# Patient Record
Sex: Male | Born: 1955 | Race: White | Hispanic: No | Smoking: Never smoker
Health system: Southern US, Community
[De-identification: ages and names within clinical notes are randomized; demographics above are authoritative.]

## PROBLEM LIST (undated history)

## (undated) DIAGNOSIS — Z87442 Personal history of urinary calculi: Secondary | ICD-10-CM

## (undated) DIAGNOSIS — E039 Hypothyroidism, unspecified: Secondary | ICD-10-CM

---

## 2013-06-06 ENCOUNTER — Ambulatory Visit: Payer: Private Health Insurance - Indemnity | Admitting: Licensed Clinical Social Worker

## 2013-06-13 ENCOUNTER — Ambulatory Visit (INDEPENDENT_AMBULATORY_CARE_PROVIDER_SITE_OTHER): Payer: Private Health Insurance - Indemnity | Admitting: Licensed Clinical Social Worker

## 2013-06-13 DIAGNOSIS — F429 Obsessive-compulsive disorder, unspecified: Secondary | ICD-10-CM

## 2013-06-20 ENCOUNTER — Ambulatory Visit (INDEPENDENT_AMBULATORY_CARE_PROVIDER_SITE_OTHER): Payer: Private Health Insurance - Indemnity | Admitting: Licensed Clinical Social Worker

## 2013-06-20 DIAGNOSIS — F429 Obsessive-compulsive disorder, unspecified: Secondary | ICD-10-CM

## 2013-07-11 ENCOUNTER — Ambulatory Visit (INDEPENDENT_AMBULATORY_CARE_PROVIDER_SITE_OTHER): Payer: Private Health Insurance - Indemnity | Admitting: Licensed Clinical Social Worker

## 2013-07-11 DIAGNOSIS — F429 Obsessive-compulsive disorder, unspecified: Secondary | ICD-10-CM

## 2013-08-01 ENCOUNTER — Ambulatory Visit: Payer: Private Health Insurance - Indemnity | Admitting: Licensed Clinical Social Worker

## 2016-03-28 HISTORY — PX: VARICOSE VEIN SURGERY: SHX832

## 2017-08-22 ENCOUNTER — Emergency Department (HOSPITAL_COMMUNITY): Payer: Managed Care, Other (non HMO)

## 2017-08-22 ENCOUNTER — Other Ambulatory Visit: Payer: Self-pay

## 2017-08-22 ENCOUNTER — Emergency Department (HOSPITAL_COMMUNITY)
Admission: EM | Admit: 2017-08-22 | Discharge: 2017-08-22 | Disposition: A | Discharge status: Home / Self Care | Payer: Managed Care, Other (non HMO) | Attending: Emergency Medicine | Admitting: Emergency Medicine

## 2017-08-22 ENCOUNTER — Encounter (HOSPITAL_COMMUNITY): Payer: Self-pay | Admitting: Obstetrics and Gynecology

## 2017-08-22 DIAGNOSIS — N132 Hydronephrosis with renal and ureteral calculous obstruction: Secondary | ICD-10-CM | POA: Diagnosis not present

## 2017-08-22 DIAGNOSIS — Z79899 Other long term (current) drug therapy: Secondary | ICD-10-CM | POA: Diagnosis not present

## 2017-08-22 DIAGNOSIS — E039 Hypothyroidism, unspecified: Secondary | ICD-10-CM | POA: Insufficient documentation

## 2017-08-22 DIAGNOSIS — K5790 Diverticulosis of intestine, part unspecified, without perforation or abscess without bleeding: Secondary | ICD-10-CM

## 2017-08-22 DIAGNOSIS — R1032 Left lower quadrant pain: Secondary | ICD-10-CM | POA: Diagnosis present

## 2017-08-22 LAB — URINALYSIS, ROUTINE W REFLEX MICROSCOPIC
Bilirubin Urine: NEGATIVE
Glucose, UA: NEGATIVE mg/dL
Ketones, ur: NEGATIVE mg/dL
Leukocytes, UA: NEGATIVE
Nitrite: NEGATIVE
Protein, ur: 30 mg/dL — AB
RBC / HPF: >50 RBC/hpf — ABNORMAL HIGH (ref 0–5)
Specific Gravity, Urine: 1.019 (ref 1.005–1.030)
pH: 7 (ref 5.0–8.0)

## 2017-08-22 LAB — COMPREHENSIVE METABOLIC PANEL
ALT: 21 U/L (ref 17–63)
AST: 20 U/L (ref 15–41)
Albumin: 4.5 g/dL (ref 3.5–5.0)
Alkaline Phosphatase: 78 U/L (ref 38–126)
Anion gap: 12 (ref 5–15)
BUN: 23 mg/dL — ABNORMAL HIGH (ref 6–20)
CO2: 24 mmol/L (ref 22–32)
Calcium: 9.3 mg/dL (ref 8.9–10.3)
Chloride: 107 mmol/L (ref 101–111)
Creatinine, Ser: 1.19 mg/dL (ref 0.61–1.24)
GFR calc Af Amer: >60 mL/min (ref >60)
GFR calc non Af Amer: >60 mL/min (ref >60)
Glucose, Bld: 118 mg/dL — ABNORMAL HIGH (ref 65–99)
Potassium: 4.1 mmol/L (ref 3.5–5.1)
Sodium: 143 mmol/L (ref 135–145)
Total Bilirubin: 0.6 mg/dL (ref 0.3–1.2)
Total Protein: 7.2 g/dL (ref 6.5–8.1)

## 2017-08-22 LAB — CBC
HCT: 42.5 % (ref 39.0–52.0)
Hemoglobin: 14 g/dL (ref 13.0–17.0)
MCH: 32.6 pg (ref 26.0–34.0)
MCHC: 32.9 g/dL (ref 30.0–36.0)
MCV: 98.8 fL (ref 78.0–100.0)
Platelets: 171 10*3/uL (ref 150–400)
RBC: 4.3 MIL/uL (ref 4.22–5.81)
RDW: 12.7 % (ref 11.5–15.5)
WBC: 7.3 10*3/uL (ref 4.0–10.5)

## 2017-08-22 LAB — LIPASE, BLOOD: Lipase: 34 U/L (ref 11–51)

## 2017-08-22 LAB — I-STAT TROPONIN, ED: Troponin i, poc: 0.01 ng/mL (ref 0.00–0.08)

## 2017-08-22 MED ORDER — MORPHINE SULFATE (PF) 4 MG/ML IV SOLN
4.0000 mg | Freq: Once | INTRAVENOUS | Status: AC
  Administered 2017-08-22: 4 mg via INTRAVENOUS
  Filled 2017-08-22: qty 1

## 2017-08-22 MED ORDER — IOPAMIDOL (ISOVUE-300) INJECTION 61%
INTRAVENOUS | Status: AC
  Filled 2017-08-22: qty 100

## 2017-08-22 MED ORDER — MORPHINE SULFATE (PF) 4 MG/ML IV SOLN
4.0000 mg | Freq: Once | INTRAVENOUS | Status: AC | PRN
  Administered 2017-08-22: 4 mg via INTRAVENOUS
  Filled 2017-08-22: qty 1

## 2017-08-22 MED ORDER — ONDANSETRON HCL 4 MG/2ML IJ SOLN
4.0000 mg | Freq: Once | INTRAMUSCULAR | Status: AC
  Administered 2017-08-22: 4 mg via INTRAVENOUS
  Filled 2017-08-22: qty 2

## 2017-08-22 MED ORDER — SODIUM CHLORIDE 0.9 % IV BOLUS
500.0000 mL | Freq: Once | INTRAVENOUS | Status: AC
  Administered 2017-08-22: 500 mL via INTRAVENOUS

## 2017-08-22 MED ORDER — IOPAMIDOL (ISOVUE-300) INJECTION 61%
100.0000 mL | Freq: Once | INTRAVENOUS | Status: AC | PRN
  Administered 2017-08-22: 100 mL via INTRAVENOUS

## 2017-08-22 MED ORDER — HYDROCODONE-ACETAMINOPHEN 5-325 MG PO TABS: 1.0000 | ORAL_TABLET | ORAL | 0 refills | Status: DC | PRN

## 2017-08-22 MED ORDER — ONDANSETRON 4 MG PO TBDP: 4.0000 mg | ORAL_TABLET | Freq: Three times a day (TID) | ORAL | 0 refills | Status: DC | PRN

## 2017-08-22 NOTE — ED Triage Notes (Signed)
Per EMS: Pt has sudden onset left abdominal pain and flank area. PT reports nothing he does makes the pain better or worse. Pt reports he took tums, tried to have a BM with no relief on either. Pt has no hx of GI problems. Pt still has his galbladder and no hx of kidney stones. Pt has had 250 of fluids and has had some dry heaving. Pt was also given 4 of zofran and of fentanyl.

## 2017-08-22 NOTE — ED Notes (Signed)
ED Provider at bedside. 

## 2017-08-22 NOTE — ED Provider Notes (Signed)
[facility]-EMERGENCY DEPT Provider Note   CSN: [id number] Arrival date & time: 08/22/17  1453     History   Chief Complaint Chief Complaint  Patient presents with  . Abdominal Pain    HPI Gregory Davidson is a 62 y.o. male with a past medical history of hypothyroid who presents today for evaluation of abdominal pain.  He reports that he was at work when he had sudden onset of pain in his left lower abdomen/flank area.  He reports the pain is waxing and waning, sharp in nature.  He had fluids,  zofran, 100 mcg of fentanyl by ems PTA.  He has never had abdominal surgery in the past.  He reports no history of kidney stones.  When the pain started he tried tums with out relief.  He had some nausea and dry heaving from the pain.  No diarrhea or constipation.  No trauma.    HPI  History reviewed. No pertinent past medical history.  Patient Active Problem List   Diagnosis Date Noted  . Diverticulosis 08/22/2017    History reviewed. No pertinent surgical history.      Home Medications    Prior to Admission medications   Medication Sig Start Date End Date Taking? Authorizing Provider  levothyroxine (SYNTHROID, LEVOTHROID) 75 MCG tablet Take 75 mcg by mouth daily before breakfast.   Yes [provider]  HYDROcodone-acetaminophen (NORCO/VICODIN) 5-325 MG tablet Take 1 tablet by mouth every 4 (four) hours as needed. 08/22/17   Cristina Gong, PA-C  ondansetron (ZOFRAN ODT) 4 MG disintegrating tablet Take 1 tablet (4 mg total) by mouth every 8 (eight) hours as needed for nausea or vomiting. 08/22/17   Cristina Gong, PA-C    Family History No family history on file.  Social History Social History   Tobacco Use  . Smoking status: Never Smoker  . Smokeless tobacco: Never Used  Substance Use Topics  . Alcohol use: Yes    Comment: Social  . Drug use: Never     Allergies   Lactose intolerance (gi)   Review of Systems Review of  Systems  Constitutional: Negative for chills and fever.  HENT: Negative for ear pain and sore throat.   Eyes: Negative for pain and visual disturbance.  Respiratory: Negative for cough, chest tightness and shortness of breath.   Cardiovascular: Negative for chest pain and palpitations.  Gastrointestinal: Positive for abdominal pain, nausea and vomiting (Dry heaving).  Genitourinary: Negative for dysuria and hematuria.  Musculoskeletal: Positive for back pain. Negative for arthralgias, neck pain and neck stiffness.  Skin: Negative for color change and rash.       Reports that for the past few weeks his bilateral nipples have been sensitive.   Neurological: Negative for dizziness, seizures, syncope and headaches.  All other systems reviewed and are negative.    Physical Exam Updated Vital Signs BP (!) 157/85   Pulse 78   Temp 97.8 F (36.6 C) (Oral)   Resp 17   Ht  (1.753 m)   Wt 86.2 kg (190 lb)   SpO2 95%   BMI 28.06 kg/m   Physical Exam  Constitutional: He is oriented to person, place, and time. He appears well-developed and well-nourished. No distress.  HENT:  Head: Normocephalic and atraumatic.  Eyes: Conjunctivae are normal.  Neck: Neck supple.  Cardiovascular: Normal rate and regular rhythm.  No murmur heard. Pulmonary/Chest: Effort normal and breath sounds normal. No respiratory distress.  Abdominal: Soft. Normal appearance  and bowel sounds are normal. There is no tenderness. There is no rigidity, no rebound, no guarding and no CVA tenderness.  Unable to recreate the abdominal pain with palpation.    Musculoskeletal: He exhibits no edema.  Neurological: He is alert and oriented to person, place, and time.  Skin: Skin is warm and dry.  Psychiatric: He has a normal mood and affect.  Nursing note and vitals reviewed.    ED Treatments / Results  Labs (all labs ordered are listed, but only abnormal results are displayed) Labs Reviewed  COMPREHENSIVE METABOLIC  PANEL - Abnormal; Notable for the following components:      Result Value   Glucose, Bld 118 (*)    BUN 23 (*)    All other components within normal limits  URINALYSIS, ROUTINE W REFLEX MICROSCOPIC - Abnormal; Notable for the following components:   APPearance HAZY (*)    Hgb urine dipstick LARGE (*)    Protein, ur 30 (*)    RBC / HPF >50 (*)    Bacteria, UA RARE (*)    All other components within normal limits  LIPASE, BLOOD  CBC  I-STAT TROPONIN, ED    EKG None  Radiology Ct Abdomen Pelvis W Contrast  Result Date: 08/22/2017 CLINICAL DATA:  Left abdominal pain.  Suspect diverticulitis EXAM: CT ABDOMEN AND PELVIS WITH CONTRAST TECHNIQUE: Multidetector CT imaging of the abdomen and pelvis was performed using the standard protocol following bolus administration of intravenous contrast. CONTRAST:  ISOVUE-300 IOPAMIDOL (ISOVUE-300) INJECTION 61% COMPARISON:  None. FINDINGS: Lower chest: Lung bases clear bilaterally. Hepatobiliary: Gallbladder and bile ducts normal.  Normal liver. Pancreas: Negative Spleen: Negative Adrenals/Urinary Tract: Left hydronephrosis with perinephric edema. 5 x 6 mm obstructing stone proximal left ureter. Right kidney normal. Normal urinary bladder. Stomach/Bowel: Negative for bowel obstruction. No bowel mass or edema. Mild sigmoid diverticulosis without diverticulitis. Normal appendix Vascular/Lymphatic: Mild atherosclerotic disease in the aorta. No adenopathy. Reproductive: Moderate prostate enlargement. Other: No free fluid.  Small inguinal hernia bilaterally. Musculoskeletal: Negative IMPRESSION: 4 x 6 mm obstructing stone proximal left ureter. Sigmoid diverticulosis without diverticulitis. Electronically Signed   By: Marlan Palau M.D.   On: 08/22/2017 17:16    Procedures Procedures (including critical care time)  Medications Ordered in ED Medications  sodium chloride 0.9 % bolus 500 mL (0 mLs Intravenous Stopped 08/22/17 1740)  morphine 4 MG/ML  injection 4 mg (4 mg Intravenous Given 08/22/17 1620)  iopamidol (ISOVUE-300) 61 % injection 100 mL (100 mLs Intravenous Contrast Given 08/22/17 1700)  morphine 4 MG/ML injection 4 mg (4 mg Intravenous Given 08/22/17 1806)  ondansetron (ZOFRAN) injection 4 mg (4 mg Intravenous Given 08/22/17 1806)     Initial Impression / Assessment and Plan / ED Course  I have reviewed the triage vital signs and the nursing notes.  Pertinent labs & imaging results that were available during my care of the patient were reviewed by me and considered in my medical decision making (see chart for details).  Clinical Course as of Aug 24 111  Tue Aug 22, 2017  1716 Notified by CT rad tech that patient was in a large amount of pain.  Additional pain medicine reordered.   [EH]  1811 Left-sided 5 x 6 mm proximal obstructing renal stone.   [EH]  1930 Patient re-evaluated, nausea and pain are controlled, states is ready for discharge.    [EH]    Clinical Course User Index [EH] Cristina Gong, PA-C   Patient presents today for  evaluation of sudden onset of left abdominal/flank pain.  He had a sudden onset of this today while he was at work.  Urine was hazy with large blood, 30 protein, nitrate and leukocyte negative.  Troponin was normal, normal lipase and LFTs.  CT abdomen pelvis with contrast was obtained showing diverticulosis without evidence of diverticulitis, and a left-sided 5 x 6 mm proximal right ureteral stone that is obstructing with hydronephrosis.  Urine is not obviously infected.  Discussed with patient goals of symptom management.  His pain was treated in the emergency room with morphine which provided significant relief.  His nausea was treated with Zofran.  Discussed with patient that his stone is large and he needs to follow-up with urology as there is a good chance that he may not be able to pass it based on its size.  He was given prescriptions for pain and nausea medicines at home.  Return  precautions were discussed with patient who states his understanding.  He was given urology follow-up with instructions to call them tomorrow.  Patient discharged home.  Final Clinical Impressions(s) / ED Diagnoses   Final diagnoses:  Ureteral stone with hydronephrosis  Diverticulosis    ED Discharge Orders        Ordered    ondansetron (ZOFRAN ODT) 4 MG disintegrating tablet  Every 8 hours PRN     08/22/17 1935    HYDROcodone-acetaminophen (NORCO/VICODIN) 5-325 MG tablet  Every 4 hours PRN     08/22/17 1935       Norman Clay 08/23/17 0113    Loren Racer, MD 08/28/17 1520

## 2017-08-22 NOTE — Discharge Instructions (Addendum)
Today your scan showed a left-sided 5 x 6 mm obstructing ureteral stone.  This is a kidney stone in the tube between your bladder and kidney.  I have given you follow-up with urology.  Please call them tomorrow morning for an appointment.    Please take Ibuprofen (Advil, motrin) and Tylenol (acetaminophen) to relieve your pain.  You may take up to 600 MG (3 pills) of normal strength ibuprofen every 8 hours as needed.  In between doses of ibuprofen you make take tylenol, up to 1,000 mg (two extra strength pills).  Do not take more than 3,000 mg tylenol in a 24 hour period.  Please check all medication labels as many medications such as pain and cold medications may contain tylenol.  Do not drink alcohol while taking these medications.  Do not take other NSAID'S while taking ibuprofen (such as aleve or naproxen).  Please take ibuprofen with food to decrease stomach upset.  Today you received medications that may make you sleepy or impair your ability to make decisions.  For the next 24 hours please do not drive, operate heavy machinery, care for a small child with out another adult present, or perform any activities that may cause harm to you or someone else if you were to fall asleep or be impaired.   You are being prescribed a medication which may make you sleepy. Please follow up of listed precautions for at least 24 hours after taking one dose.

## 2017-08-22 NOTE — ED Notes (Signed)
Bed: [facility] Expected date:  Expected time:  Means of arrival:  Comments: 

## 2017-08-24 ENCOUNTER — Encounter (HOSPITAL_COMMUNITY): Payer: Self-pay | Admitting: *Deleted

## 2017-08-24 ENCOUNTER — Other Ambulatory Visit: Payer: Self-pay | Admitting: Urology

## 2017-08-25 ENCOUNTER — Encounter (HOSPITAL_COMMUNITY): Payer: Self-pay | Admitting: *Deleted

## 2017-08-28 ENCOUNTER — Encounter (HOSPITAL_COMMUNITY)
Admission: RE | Disposition: A | Discharge status: Home / Self Care | Payer: Self-pay | Source: Other Acute Inpatient Hospital | Attending: Urology

## 2017-08-28 ENCOUNTER — Other Ambulatory Visit: Payer: Self-pay

## 2017-08-28 ENCOUNTER — Encounter (HOSPITAL_COMMUNITY): Payer: Self-pay | Admitting: *Deleted

## 2017-08-28 ENCOUNTER — Ambulatory Visit (HOSPITAL_COMMUNITY): Payer: Managed Care, Other (non HMO)

## 2017-08-28 ENCOUNTER — Ambulatory Visit (HOSPITAL_COMMUNITY)
Admission: RE | Admit: 2017-08-28 | Discharge: 2017-08-28 | Disposition: A | Discharge status: Home / Self Care | Payer: Managed Care, Other (non HMO) | Source: Other Acute Inpatient Hospital | Attending: Urology | Admitting: Urology

## 2017-08-28 DIAGNOSIS — N201 Calculus of ureter: Secondary | ICD-10-CM | POA: Diagnosis present

## 2017-08-28 DIAGNOSIS — E039 Hypothyroidism, unspecified: Secondary | ICD-10-CM | POA: Insufficient documentation

## 2017-08-28 DIAGNOSIS — Z79890 Hormone replacement therapy (postmenopausal): Secondary | ICD-10-CM | POA: Diagnosis not present

## 2017-08-28 HISTORY — PX: EXTRACORPOREAL SHOCK WAVE LITHOTRIPSY: SHX1557

## 2017-08-28 HISTORY — DX: Hypothyroidism, unspecified: E03.9

## 2017-08-28 HISTORY — DX: Personal history of urinary calculi: Z87.442

## 2017-08-28 SURGERY — LITHOTRIPSY, ESWL
Anesthesia: LOCAL | Laterality: Left

## 2017-08-28 MED ORDER — CIPROFLOXACIN HCL 500 MG PO TABS
500.0000 mg | ORAL_TABLET | ORAL | Status: AC
  Administered 2017-08-28: 500 mg via ORAL
  Filled 2017-08-28: qty 1

## 2017-08-28 MED ORDER — SODIUM CHLORIDE 0.9 % IV SOLN
INTRAVENOUS | Status: DC
  Administered 2017-08-28: 10:00:00 via INTRAVENOUS

## 2017-08-28 MED ORDER — DIAZEPAM 5 MG PO TABS
10.0000 mg | ORAL_TABLET | ORAL | Status: AC
  Administered 2017-08-28: 10 mg via ORAL
  Filled 2017-08-28: qty 2

## 2017-08-28 MED ORDER — DIPHENHYDRAMINE HCL 25 MG PO CAPS
25.0000 mg | ORAL_CAPSULE | ORAL | Status: AC
  Administered 2017-08-28: 25 mg via ORAL
  Filled 2017-08-28: qty 1

## 2017-08-28 MED ORDER — TRAMADOL HCL 50 MG PO TABS: 50.0000 mg | ORAL_TABLET | Freq: Four times a day (QID) | ORAL | 0 refills | Status: AC | PRN

## 2017-08-28 NOTE — H&P (Signed)
CC: I have ureteral stone.  HPI: Gregory Davidson is a 62 year-old male patient who was referred by Dr. Derenda Fennel, MD who is here for ureteral stone.  The problem is on the left side. He first stated noticing pain on 08/22/2017. This is his first kidney stone. He is currently having flank pain. He denies having back pain, groin pain, nausea, vomiting, fever, and chills. Pain is occuring on the left side. He has not caught a stone in his urine strainer since his symptoms began.   He has never had surgical treatment for calculi in the past.   Patient developed left flank pain. He underwent CT scan Aug 22, 2017 that revealed a 6 mm left proximal ureteral stone (ssd 11 cm, HU 1030, visible). No other stones. WBC = 7, bun 23, cr 1.19, UA with 11-20 wbc, > 50 rbc, Neg LE / N.   He has pain and takes hydrocodone and ibuprofen.     ALLERGIES: None   MEDICATIONS: Levothyroxine Sodium 75 mcg tablet     GU PSH: No GU PSH      PSH Notes: cosmetic vein work done on rt leg per pt   NON-GU PSH: No Non-GU PSH    GU PMH: None     PMH Notes:  1898-03-28 00:00:00 - Note: Normal Routine History And Physical Adult   NON-GU PMH: Hypothyroidism    FAMILY HISTORY: No pertinent family history - Runs in Family   SOCIAL HISTORY: Marital Status: Divorced Preferred Language: English; Race: White Current Smoking Status: Patient has never smoked.   Tobacco Use Assessment Completed: Used Tobacco in last 30 days? Has never drank.  Drinks 1 caffeinated drink per day. 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     Notes: 2 daughters   REVIEW OF SYSTEMS:    GU Review Male:   Patient reports hard to postpone urination, get up at night to urinate, and erection problems. Patient denies frequent urination, burning/ pain with urination, leakage of urine, stream starts and stops, trouble starting your stream, have to strain to urinate , and penile pain.  Gastrointestinal (Upper):   Patient reports nausea and  vomiting. Patient denies indigestion/ heartburn.  Gastrointestinal (Lower):   Patient reports diarrhea. Patient denies constipation.  Constitutional:   Patient denies fever, night sweats, weight loss, and fatigue.  Skin:   Patient denies skin rash/ lesion and itching.  Eyes:   Patient denies blurred vision and double vision.  Ears/ Nose/ Throat:   Patient denies sore throat and sinus problems.  Hematologic/Lymphatic:   Patient denies swollen glands and easy bruising.  Cardiovascular:   Patient denies leg swelling and chest pains.  Respiratory:   Patient denies cough and shortness of breath.  Endocrine:   Patient denies excessive thirst.  Musculoskeletal:   Patient reports joint pain. Patient denies back pain.  Neurological:   Patient denies headaches and dizziness.  Psychologic:   Patient denies depression and anxiety.   VITAL SIGNS:      08/24/2017 11:12 AM  Weight 185 lb / 83.91 kg  Height 69 in / 175.26 cm  BP 151/81 mmHg  Pulse 65 /min  Temperature 97.9 F / 36.6 C  BMI 27.3 kg/m   MULTI-SYSTEM PHYSICAL EXAMINATION:    Constitutional: Well-nourished. No physical deformities. Normally developed. Good grooming.  Neck: Neck symmetrical, not swollen. Normal tracheal position.  Respiratory: No labored breathing, no use of accessory muscles. Normal breath sounds.  Cardiovascular: Regular rate and rhythm.Normal temperature, normal extremity pulses, no swelling,  no varicosities.   Lymphatic: No enlargement of neck, axillae, groin.  Skin: No paleness, no jaundice, no cyanosis. No lesion, no ulcer, no rash.  Neurologic / Psychiatric: Oriented to time, oriented to place, oriented to person. No depression, no anxiety, no agitation.  Gastrointestinal: No mass, no tenderness, no rigidity, non obese abdomen.  Eyes: Normal conjunctivae. Normal eyelids.  Ears, Nose, Mouth, and Throat: Left ear no scars, no lesions, no masses. Right ear no scars, no lesions, no masses. Nose no scars, no lesions,  no masses. Normal hearing. Normal lips.  Musculoskeletal: Normal gait and station of head and neck.     PAST DATA REVIEWED:  Source Of History:  Patient  Lab Test Review:   BUN/Creatinine  Records Review:   Previous Hospital Records  X-Ray Review: C.T. Abdomen/Pelvis: Reviewed Films. may 2019     PROCEDURES:         KUB - 74018  A single view of the abdomen is obtained.  Calculi:  6 mm left proximal stone       The bones appeared normal. The bowel gas pattern appeared normal. The soft tissues were unremarkable.         Urinalysis w/Scope - 81001 Dipstick Dipstick Cont'd Micro  Color: Yellow Bilirubin: Neg WBC/hpf: NS (Not Seen)  Appearance: Clear Ketones: 1+ RBC/hpf: 0 - 2/hpf  Specific Gravity: 1.025 Blood: Trace Bacteria: Rare (0-9/hpf)  pH: 5.5 Protein: Trace Cystals: NS (Not Seen)  Glucose: Neg Urobilinogen: 0.2 Casts: NS (Not Seen)    Nitrites: Neg Trichomonas: Not Present    Leukocyte Esterase: Neg Mucous: Not Present      Epithelial Cells: NS (Not Seen)      Yeast: NS (Not Seen)      Sperm: Not Present         Ketoralac 60mg  - [id number], [id number] Qty: 60 Adm. By: Delmar Landau  Unit: mg Lot No [id number]  Route: IM Exp. Date 03/29/2019  Freq: None Mfgr.:   Site: Right Buttock         Phenergan 25mg  - [id number], [id number] Qty: 25 Adm. By: Delmar Landau  Unit: mg Lot No [id number]  Route: IM Exp. Date 11/27/2018  Freq: None Mfgr.:   Site: Left Buttock   ASSESSMENT:      ICD-10 Details  1 GU:   Ureteral calculus - N20.1    PLAN:            Medications New Meds: Promethazine Hcl 12.5 mg tablet 1 tablet PO Q 6 H   #12  0 Refill(s)  Tramadol Hcl 50 mg tablet 1-2 tablet PO Q 6 H PRN   #15  0 Refill(s)            Orders X-Rays: KUB          Schedule Return Visit/Planned Activity: Next Available Appointment - Schedule Surgery             Note: ESWL for Monday           Document Letter(s):  Created for Chart: Work Excuse   Created for Patient: Clinical Summary          Notes:   left ureteral stone - he was given ketorolac 60 mg IM and promethazine . KUB today with stone in left proximal ureter. I discussed with the patient the nature risks and benefits of continued stone passage, off label use of alpha blockers, shockwave lithotripsy or ureteroscopy. All questions answered. He will proceed with left ESWL. Also, he'll start a pepcid  or zantac OTC. He c/o indigestion. We discussed dietary changes to prevent stones.   cc: Dr. Lenise ArenaMeyers         Next Appointment:      Next Appointment: 08/28/2017 11:15 AM    Appointment Type: Surgery     Location: [facility] [phone]    Provider: Modena SlaterEugene Bell, III, M.D.    Reason for Visit: [facility]/OP LEFT ESWL      Signed by Jerilee FieldMatthew Eskridge, M.D. on 08/24/17 at 6:33 PM (EDT

## 2017-08-28 NOTE — Discharge Instructions (Signed)
Moderate Conscious Sedation, Adult, Care After °These instructions provide you with information about caring for yourself after your procedure. Your health care provider may also give you more specific instructions. Your treatment has been planned according to current medical practices, but problems sometimes occur. Call your health care provider if you have any problems or questions after your procedure. °What can I expect after the procedure? °After your procedure, it is common: °To feel sleepy for several hours. °To feel clumsy and have poor balance for several hours. °To have poor judgment for several hours. °To vomit if you eat too soon. ° °Follow these instructions at home: °For at least 24 hours after the procedure: ° °Do not: °Participate in activities where you could fall or become injured. °Drive. °Use heavy machinery. °Drink alcohol. °Take sleeping pills or medicines that cause drowsiness. °Make important decisions or sign legal documents. °Take care of children on your own. °Rest. °Eating and drinking °Follow the diet recommended by your health care provider. °If you vomit: °Drink water, juice, or soup when you can drink without vomiting. °Make sure you have little or no nausea before eating solid foods. °General instructions °Have a responsible adult stay with you until you are awake and alert. °Take over-the-counter and prescription medicines only as told by your health care provider. °If you smoke, do not smoke without supervision. °Keep all follow-up visits as told by your health care provider. This is important. °Contact a health care provider if: °You keep feeling nauseous or you keep vomiting. °You feel light-headed. °You develop a rash. °You have a fever. °Get help right away if: °You have trouble breathing. °This information is not intended to replace advice given to you by your health care provider. Make sure you discuss any questions you have with your health care provider. °Document Released:  01/02/2013 Document Revised: 08/17/2015 Document Reviewed: 07/04/2015 °Elsevier Interactive Patient Education © 2018 [employer] °Lithotripsy, Care After °This sheet gives you information about how to care for yourself after your procedure. Your health care provider may also give you more specific instructions. If you have problems or questions, contact your health care provider. °What can I expect after the procedure? °After the procedure, it is common to have: °· Some blood in your urine. This should only last for a few days. °· Soreness in your back, sides, or upper abdomen for a few days. °· Blotches or bruises on your back where the pressure wave entered the skin. °· Pain, discomfort, or nausea when pieces (fragments) of the kidney stone move through the tube that carries urine from the kidney to the bladder (ureter). Stone fragments may pass soon after the procedure, but they may continue to pass for up to 4-8 weeks. °? If you have severe pain or nausea, contact your health care provider. This may be caused by a large stone that was not broken up, and this may mean that you need more treatment. °· Some pain or discomfort during urination. °· Some pain or discomfort in the lower abdomen or (in men) at the base of the penis. ° °Follow these instructions at home: °Medicines °· Take over-the-counter and prescription medicines only as told by your health care provider. °· If you were prescribed an antibiotic medicine, take it as told by your health care provider. Do not stop taking the antibiotic even if you start to feel better. °· Do not drive for 24 hours if you were given a medicine to help you relax (sedative). °· Do not drive   or use heavy machinery while taking prescription pain medicine. °Eating and drinking °· Drink enough water and fluids to keep your urine clear or pale yellow. This helps any remaining pieces of the stone to pass. It can also help prevent new stones from forming. °· Eat plenty of fresh  fruits and vegetables. °· Follow instructions from your health care provider about eating and drinking restrictions. You may be instructed: °? To reduce how much salt (sodium) you eat or drink. Check ingredients and nutrition facts on packaged foods and beverages. °? To reduce how much meat you eat. °· Eat the recommended amount of calcium for your age and gender. Ask your health care provider how much calcium you should have. °General instructions °· Get plenty of rest. °· Most people can resume normal activities 1-2 days after the procedure. Ask your health care provider what activities are safe for you. °· If directed, strain all urine through the strainer that was provided by your health care provider. °? Keep all fragments for your health care provider to see. Any stones that are found may be sent to a medical lab for examination. The stone may be as small as a grain of salt. °· Keep all follow-up visits as told by your health care provider. This is important. °Contact a health care provider if: °· You have pain that is severe or does not get better with medicine. °· You have nausea that is severe or does not go away. °· You have blood in your urine longer than your health care provider told you to expect. °· You have more blood in your urine. °· You have pain during urination that does not go away. °· You urinate more frequently than usual and this does not go away. °· You develop a rash or any other possible signs of an allergic reaction. °Get help right away if: °· You have severe pain in your back, sides, or upper abdomen. °· You have severe pain while urinating. °· Your urine is very dark red. °· You have blood in your stool (feces). °· You cannot pass any urine at all. °· You feel a strong urge to urinate after emptying your bladder. °· You have a fever or chills. °· You develop shortness of breath, difficulty breathing, or chest pain. °· You have severe nausea that leads to persistent vomiting. °· You  faint. °Summary °· After this procedure, it is common to have some pain, discomfort, or nausea when pieces (fragments) of the kidney stone move through the tube that carries urine from the kidney to the bladder (ureter). If this pain or nausea is severe, however, you should contact your health care provider. °· Most people can resume normal activities 1-2 days after the procedure. Ask your health care provider what activities are safe for you. °· Drink enough water and fluids to keep your urine clear or pale yellow. This helps any remaining pieces of the stone to pass, and it can help prevent new stones from forming. °· If directed, strain your urine and keep all fragments for your health care provider to see. Fragments or stones may be as small as a grain of salt. °· Get help right away if you have severe pain in your back, sides, or upper abdomen or have severe pain while urinating. °This information is not intended to replace advice given to you by your health care provider. Make sure you discuss any questions you have with your health care provider. °Document Released: 04/03/2007 Document Revised:   02/03/2016 Document Reviewed: 02/03/2016 °Elsevier Interactive Patient Education © 2018 [employer] ° °

## 2017-08-28 NOTE — Progress Notes (Signed)
Pt and his friend returned to facility to state that did not have pain med prescription in which nurse stated she had provide to Bill pts friend with discharge paperwork. Both friend and pt denied having and Dr Alvester MorinBell made aware of situation and provided new prescription.

## 2017-08-29 ENCOUNTER — Encounter (HOSPITAL_COMMUNITY): Payer: Self-pay | Admitting: Urology

## 2018-11-12 ENCOUNTER — Other Ambulatory Visit: Payer: Self-pay | Admitting: Nurse Practitioner

## 2018-11-12 DIAGNOSIS — R1031 Right lower quadrant pain: Secondary | ICD-10-CM

## 2018-11-13 ENCOUNTER — Ambulatory Visit
Admission: RE | Admit: 2018-11-13 | Discharge: 2018-11-13 | Disposition: A | Discharge status: Home / Self Care | Payer: Managed Care, Other (non HMO) | Source: Ambulatory Visit | Attending: Nurse Practitioner | Admitting: Nurse Practitioner

## 2018-11-13 DIAGNOSIS — R1031 Right lower quadrant pain: Secondary | ICD-10-CM

## 2018-11-28 ENCOUNTER — Ambulatory Visit: Payer: Self-pay | Admitting: General Surgery

## 2018-11-28 NOTE — H&P (Signed)
History of Present Illness Gregory Ok MD; 11/28/2018 10:40 AM) The patient is a 63 year old male who presents with an inguinal hernia.Referred by: Dr. Elesa Davidson Chief Complaint: Right inguinal hernia  Patient is a 63 year old male with past medical history significant for hypothyroidism. Patient comes in secondary to a 2 to three-month history of the right inguinal hernia. Patient states that he noticed some "tingling" to the right inguinal area with bending over and lifting. Patient works at [employer] and does do some heavy lifting at times. Patient states that he has not noticed any bulge the right inguinal area. He states when he is not lifting he has no pain. Patient has had no signs or symptoms of incarceration or strangulation.  He's had no previous abdominal surgery.    Past Surgical History Gregory Davidson Gregory Davidson, Gregory Davidson; 11/28/2018 10:29 AM) No pertinent past surgical history   Diagnostic Studies History Gregory Davidson Gregory Davidson, Gregory Davidson; 11/28/2018 10:29 AM) Colonoscopy  1-5 years ago  Allergies Gregory Davidson Gregory Davidson, Gregory Davidson; 11/28/2018 10:30 AM) No Known Drug Allergies  [11/28/2018]: Allergies Reconciled   Medication History [employer], Gregory Davidson; 11/28/2018 10:31 AM) Levothyroxine Sodium (75MCG Tablet, Oral) Active. Finasteride (1MG  Tablet, Oral) Active. Medications Reconciled  Social History Gregory Davidson Gregory Davidson, Gregory Davidson; 11/28/2018 10:29 AM) Alcohol use  Occasional alcohol use. Caffeine use  Carbonated beverages. No drug use  Tobacco use  Never smoker.  Family History Gregory Davidson Gregory Davidson, Gregory Davidson; 11/28/2018 10:29 AM) First Degree Relatives  No pertinent family history   Other Problems Gregory Davidson Gregory Davidson, Gregory Davidson; 11/28/2018 10:29 AM) Thyroid Disease     Review of Systems Gregory Ok MD; 11/28/2018 10:38 AM) General Not Present- Appetite Loss, Chills, Fatigue, Fever, Night Sweats, Weight Gain and Weight Loss. Skin Not Present- Change in Wart/Mole, Dryness, Hives, Jaundice,  New Lesions, Non-Healing Wounds, Rash and Ulcer. HEENT Not Present- Earache, Hearing Loss, Hoarseness, Nose Bleed, Oral Ulcers, Ringing in the Ears, Seasonal Allergies, Sinus Pain, Sore Throat, Visual Disturbances, Wears glasses/contact lenses and Yellow Eyes. Respiratory Not Present- Bloody sputum, Chronic Cough, Difficulty Breathing, Snoring and Wheezing. Breast Not Present- Breast Mass, Breast Pain, Nipple Discharge and Skin Changes. Cardiovascular Not Present- Chest Pain, Difficulty Breathing Lying Down, Leg Cramps, Palpitations, Rapid Heart Rate, Shortness of Breath and Swelling of Extremities. Gastrointestinal Present- Abdominal Pain. Not Present- Bloating, Bloody Stool, Change in Bowel Habits, Chronic diarrhea, Constipation, Difficulty Swallowing, Excessive gas, Gets full quickly at meals, Hemorrhoids, Indigestion, Nausea, Rectal Pain and Vomiting. Male Genitourinary Not Present- Blood in Urine, Change in Urinary Stream, Frequency, Impotence, Nocturia, Painful Urination, Urgency and Urine Leakage. Musculoskeletal Not Present- Back Pain, Joint Pain, Joint Stiffness, Muscle Pain, Muscle Weakness and Swelling of Extremities. Neurological Not Present- Decreased Memory, Fainting, Headaches, Numbness, Seizures, Tingling, Tremor, Trouble walking and Weakness. Psychiatric Not Present- Anxiety, Bipolar, Change in Sleep Pattern, Depression, Fearful and Frequent crying. Endocrine Not Present- Cold Intolerance, Excessive Hunger, Hair Changes, Heat Intolerance, Hot flashes and New Diabetes. Hematology Not Present- Blood Thinners, Easy Bruising, Excessive bleeding, Gland problems, HIV and Persistent Infections. All other systems negative  Vitals Gregory Davidson Gregory Davidson Gregory Davidson; 11/28/2018 10:31 AM) 11/28/2018 10:31 AM Weight: 183.6 lb Height: 69in Body Surface Area: 1.99 m Body Mass Index: 27.11 kg/m  Temp.: 4F (Temporal)  Pulse: 60 (Regular)  P.OX: 60% (Room air) BP: 142/78(Sitting, Left Arm,  Standard)       Physical Exam Gregory Ok MD; 11/28/2018 10:40 AM) The physical exam findings are as follows: Note: Constitutional: No acute distress, conversant, appears stated age  Eyes: Anicteric sclerae, moist conjunctiva, no lid lag  Neck: No thyromegaly, trachea midline, no cervical lymphadenopathy  Lungs: Clear to auscultation biilaterally, normal respiratory effot  Cardiovascular: regular rate & rhythm, no murmurs, no peripheal edema, pedal pulses 2+  GI: Soft, no masses or hepatosplenomegaly, non-tender to palpation  MSK: Normal gait, no clubbing cyanosis, edema  Skin: No rashes, palpation reveals normal skin turgor  Psychiatric: Appropriate judgment and insight, oriented to person, place, and time  Abdomen Inspection Hernias - Inguinal hernia - Reducible - Right.    Assessment & Plan [name](Gregory Ramirez MD; 11/28/2018 10:40 AM)  RIGHT INGUINAL HERNIA (K40.90) Impression: Patient is a 63 year old male with a history of hypothyroidism Patient with a right inguinal hernia  1. The patient will like to proceed to the operating room for laparoscopic right inguinal hernia repair with mesh.  2. I discussed with the patient the signs and symptoms of incarceration and strangulation and the need to proceed to the ER should they occur.  3. I discussed with the patient the risks and benefits of the procedure to include but not limited to: Infection, bleeding, damage to surrounding structures, possible need for further surgery, possible nerve pain, and possible recurrence. The patient was understanding and wishes to proceed.

## 2019-04-25 ENCOUNTER — Ambulatory Visit: Payer: Managed Care, Other (non HMO) | Admitting: Cardiology

## 2019-05-01 NOTE — Progress Notes (Signed)
Referring-Janice Quillian Quince NP Reason for referral-Chest pain  HPI: 64 yo male for evaluation of chest pain at request of Carolee Rota NP.  Patient typically does not have dyspnea on exertion, orthopnea, PND, pedal edema, exertional chest pain or syncope.  He occasionally feels sore in his chest after working.  2 weeks ago he was sitting at a computer and developed substernal chest pressure radiating to his right upper extremity.  He describes a tingling sensation in his right upper extremity and a flushed feeling/tingling in his right face.  Symptoms lasted approximately 30 seconds and resolved.  There was no nausea, dyspnea or diaphoresis.  Cardiology now asked to evaluate.  Also note he occasionally feels a brief skip but no sustained palpitations.  Current Outpatient Medications  Medication Sig Dispense Refill  . levothyroxine (SYNTHROID, LEVOTHROID) 75 MCG tablet Take 75 mcg by mouth daily before breakfast.     No current facility-administered medications for this visit.    Allergies  Allergen Reactions  . Lactose Intolerance (Gi) Diarrhea     Past Medical History:  Diagnosis Date  . History of kidney stones   . Hypothyroidism    takes synthroid    Past Surgical History:  Procedure Laterality Date  . EXTRACORPOREAL SHOCK WAVE LITHOTRIPSY Left 08/28/2017   Procedure: LEFT EXTRACORPOREAL SHOCK WAVE LITHOTRIPSY (ESWL);  Surgeon: Lucas Mallow, MD;  Location: [facility];  Service: Urology;  Laterality: Left;  Marland Kitchen VARICOSE VEIN SURGERY Right 2018    Social History   Socioeconomic History  . Marital status: Divorced    Spouse name: Not on file  . Number of children: 2  . Years of education: Not on file  . Highest education level: Not on file  Occupational History  . Not on file  Tobacco Use  . Smoking status: Never Smoker  . Smokeless tobacco: Never Used  Substance and Sexual Activity  . Alcohol use: Yes    Comment: Social  . Drug use: Never  . Sexual activity: Yes    Other Topics Concern  . Not on file  Social History Narrative  . Not on file   Social Determinants of Health   Financial Resource Strain:   . Difficulty of Paying Living Expenses: Not on file  Food Insecurity:   . Worried About Charity fundraiser in the Last Year: Not on file  . Ran Out of Food in the Last Year: Not on file  Transportation Needs:   . Lack of Transportation (Medical): Not on file  . Lack of Transportation (Non-Medical): Not on file  Physical Activity:   . Days of Exercise per Week: Not on file  . Minutes of Exercise per Session: Not on file  Stress:   . Feeling of Stress : Not on file  Social Connections:   . Frequency of Communication with Friends and Family: Not on file  . Frequency of Social Gatherings with Friends and Family: Not on file  . Attends Religious Services: Not on file  . Active Member of Clubs or Organizations: Not on file  . Attends Archivist Meetings: Not on file  . Marital Status: Not on file  Intimate Partner Violence:   . Fear of Current or Ex-Partner: Not on file  . Emotionally Abused: Not on file  . Physically Abused: Not on file  . Sexually Abused: Not on file    History reviewed. No pertinent family history.  ROS: no fevers or chills, productive cough, hemoptysis, dysphasia, odynophagia, melena, hematochezia,  dysuria, hematuria, rash, seizure activity, orthopnea, PND, pedal edema, claudication. Remaining systems are negative.  Physical Exam:   Blood pressure 120/84, pulse 62, height 5\' 9"  (1.753 m), weight 186 lb (84.4 kg), SpO2 96 %.  General:  Well developed/well nourished in NAD Skin warm/dry Patient not depressed No peripheral clubbing Back-normal HEENT-normal/normal eyelids Neck supple/normal carotid upstroke bilaterally; no bruits; no JVD; no thyromegaly chest - CTA/ normal expansion CV - RRR/normal S1 and S2; no murmurs, rubs or gallops;  PMI nondisplaced Abdomen -NT/ND, no HSM, no mass, + bowel sounds,  no bruit 2+ femoral pulses, no bruits Ext-no edema, chords, 2+ DP Neuro-grossly nonfocal  ECG - April 17, 2019 showed sinus rhythm, no ST changes.  Today's electrocardiogram shows sinus rhythm with nonspecific ST changes.  Personally reviewed  A/P  1 Chest pain-etiology unclear.  Some of the description is concerning but short duration.  Electrocardiogram shows mild ST depression.  I will arrange a CTA to exclude significant coronary disease.  2 facial and right upper extremity tingling-we will arrange carotid Dopplers to exclude significant carotid stenosis.  3 hypothyroid-Per primary care.  April 19, 2019, MD

## 2019-05-08 ENCOUNTER — Ambulatory Visit (INDEPENDENT_AMBULATORY_CARE_PROVIDER_SITE_OTHER): Payer: Managed Care, Other (non HMO) | Admitting: Cardiology

## 2019-05-08 ENCOUNTER — Encounter: Payer: Self-pay | Admitting: Cardiology

## 2019-05-08 ENCOUNTER — Other Ambulatory Visit: Payer: Self-pay

## 2019-05-08 VITALS — BP 120/84 | HR 62 | Ht 69.0 in | Wt 186.0 lb

## 2019-05-08 DIAGNOSIS — R072 Precordial pain: Secondary | ICD-10-CM

## 2019-05-08 DIAGNOSIS — G459 Transient cerebral ischemic attack, unspecified: Secondary | ICD-10-CM

## 2019-05-08 MED ORDER — METOPROLOL TARTRATE 100 MG PO TABS: ORAL_TABLET | ORAL | 0 refills | Status: DC

## 2019-05-08 NOTE — Patient Instructions (Signed)
Medication Instructions:  NO CHANGE *If you need a refill on your cardiac medications before your next appointment, please call your pharmacy*  Lab Work: If you have labs (blood work) drawn today and your tests are completely normal, you will receive your results only by: Marland Kitchen MyChart Message (if you have MyChart) OR . A paper copy in the mail If you have any lab test that is abnormal or we need to change your treatment, we will call you to review the results.  Testing/Procedures: Your physician has requested that you have a carotid duplex. This test is an ultrasound of the carotid arteries in your neck. It looks at blood flow through these arteries that supply the brain with blood. Allow one hour for this exam. There are no restrictions or special instructions.[place] [facility]  Your cardiac CT will be scheduled at one of the below locations:   [facility] [address] [place], [place] [address] [phone]  OR  [facility] [address] [place], [place] [address] [phone]  If scheduled at [facility], please arrive at the [facility] main entrance of [facility] 30 minutes prior to test start time. Proceed to the [facility] Radiology Department (first floor) to check-in and test prep.  If scheduled at [facility], please arrive 15 mins early for check-in and test prep.  Please follow these instructions carefully (unless otherwise directed):  Hold all erectile dysfunction medications at least 3 days (72 hrs) prior to test.  On the Night Before the Test: . Be sure to Drink plenty of water. . Do not consume any caffeinated/decaffeinated beverages or chocolate 12 hours prior to your test. . Do not take any antihistamines 12 hours prior to your test. . If you take Metformin do not take 24 hours prior to test.  On the Day of the Test: . Drink plenty of water. Do not drink  any water within one hour of the test. . Do not eat any food 4 hours prior to the test. . You may take your regular medications prior to the test.  . Take metoprolol (Lopressor) 100 MG two hours prior to test. . HOLD Furosemide/Hydrochlorothiazide morning of the test. . FEMALES- please wear underwire-free bra if available       After the Test: . Drink plenty of water. . After receiving IV contrast, you may experience a mild flushed feeling. This is normal. . On occasion, you may experience a mild rash up to 24 hours after the test. This is not dangerous. If this occurs, you can take Benadryl 25 mg and increase your fluid intake. . If you experience trouble breathing, this can be serious. If it is severe call 911 IMMEDIATELY. If it is mild, please call our office. . If you take any of these medications: Glipizide/Metformin, Avandament, Glucavance, please do not take 48 hours after completing test unless otherwise instructed.   Once we have confirmed authorization from your insurance company, we will call you to set up a date and time for your test.   For non-scheduling related questions, please contact the cardiac imaging nurse navigator should you have any questions/concerns: Rockwell Alexandria, RN Navigator Cardiac Imaging [facility] [phone] mobile      Follow-Up: At [facility], you and your health needs are our priority.  As part of our continuing mission to provide you with exceptional heart care, we have created designated Provider Care Teams.  These Care Teams include your primary Cardiologist (physician) and Advanced Practice Providers (APPs -  Physician Assistants and Nurse Practitioners) who all work together to provide you with the care you need, when you need it.  Your next appointment:   6 month(s)  The format for your next appointment:   Either In Person or Virtual  Provider:   Kirk Ruths MD

## 2019-05-23 ENCOUNTER — Ambulatory Visit (HOSPITAL_BASED_OUTPATIENT_CLINIC_OR_DEPARTMENT_OTHER): Admission: RE | Admit: 2019-05-23 | Payer: 59 | Source: Ambulatory Visit

## 2019-05-29 ENCOUNTER — Other Ambulatory Visit: Payer: Self-pay | Admitting: *Deleted

## 2019-05-29 DIAGNOSIS — R072 Precordial pain: Secondary | ICD-10-CM

## 2019-06-06 ENCOUNTER — Other Ambulatory Visit: Payer: Self-pay

## 2019-06-06 ENCOUNTER — Ambulatory Visit (HOSPITAL_BASED_OUTPATIENT_CLINIC_OR_DEPARTMENT_OTHER)
Admission: RE | Admit: 2019-06-06 | Discharge: 2019-06-06 | Disposition: A | Discharge status: Home / Self Care | Payer: 59 | Source: Ambulatory Visit | Attending: Cardiology | Admitting: Cardiology

## 2019-06-06 DIAGNOSIS — G459 Transient cerebral ischemic attack, unspecified: Secondary | ICD-10-CM | POA: Diagnosis present

## 2019-06-06 NOTE — Progress Notes (Addendum)
VAS US CAROTID DUPLEX BILATERAL     06/06/2019  Sinda Du RDCS, RVT

## 2019-06-07 ENCOUNTER — Encounter: Payer: Self-pay | Admitting: *Deleted

## 2019-06-07 LAB — BASIC METABOLIC PANEL
BUN/Creatinine Ratio: 19 (ref 10–24)
BUN: 18 mg/dL (ref 8–27)
CO2: 25 mmol/L (ref 20–29)
Calcium: 9.6 mg/dL (ref 8.6–10.2)
Chloride: 103 mmol/L (ref 96–106)
Creatinine, Ser: 0.93 mg/dL (ref 0.76–1.27)
GFR calc Af Amer: 100 mL/min/{1.73_m2} (ref >59)
GFR calc non Af Amer: 86 mL/min/{1.73_m2} (ref >59)
Glucose: 87 mg/dL (ref 65–99)
Potassium: 4.8 mmol/L (ref 3.5–5.2)
Sodium: 141 mmol/L (ref 134–144)

## 2019-06-20 ENCOUNTER — Telehealth (HOSPITAL_COMMUNITY): Payer: Self-pay | Admitting: Emergency Medicine

## 2019-06-20 ENCOUNTER — Ambulatory Visit (HOSPITAL_COMMUNITY)
Admission: RE | Admit: 2019-06-20 | Discharge: 2019-06-20 | Disposition: A | Discharge status: Home / Self Care | Payer: 59 | Source: Ambulatory Visit | Attending: Cardiology | Admitting: Cardiology

## 2019-06-20 ENCOUNTER — Encounter (HOSPITAL_COMMUNITY): Payer: Self-pay

## 2019-06-20 ENCOUNTER — Other Ambulatory Visit: Payer: Self-pay

## 2019-06-20 DIAGNOSIS — Z006 Encounter for examination for normal comparison and control in clinical research program: Secondary | ICD-10-CM

## 2019-06-20 DIAGNOSIS — I2510 Atherosclerotic heart disease of native coronary artery without angina pectoris: Secondary | ICD-10-CM | POA: Diagnosis not present

## 2019-06-20 DIAGNOSIS — R911 Solitary pulmonary nodule: Secondary | ICD-10-CM | POA: Diagnosis not present

## 2019-06-20 DIAGNOSIS — R072 Precordial pain: Secondary | ICD-10-CM | POA: Diagnosis present

## 2019-06-20 MED ORDER — IOHEXOL 350 MG/ML SOLN
80.0000 mL | Freq: Once | INTRAVENOUS | Status: AC | PRN
  Administered 2019-06-20: 13:00:00 80 mL via INTRAVENOUS

## 2019-06-20 MED ORDER — NITROGLYCERIN 0.4 MG SL SUBL
SUBLINGUAL_TABLET | SUBLINGUAL | Status: AC
  Filled 2019-06-20: qty 2

## 2019-06-20 MED ORDER — NITROGLYCERIN 0.4 MG SL SUBL
0.8000 mg | SUBLINGUAL_TABLET | Freq: Once | SUBLINGUAL | Status: AC
  Administered 2019-06-20: 0.8 mg via SUBLINGUAL

## 2019-06-20 NOTE — Research (Signed)
Cadfem Informed Consent    Patient Name: Gregory Davidson   Subject met inclusion and exclusion criteria.  The informed consent form, study requirements and expectations were reviewed with the subject and questions and concerns were addressed prior to the signing of the consent form.  The subject verbalized understanding of the trail requirements.  The subject agreed to participate in the CADFEM trial and signed the informed consent.  The informed consent was obtained prior to performance of any protocol-specific procedures for the subject.  A copy of the signed informed consent was given to the subject and a copy was placed in the subject's medical record.   [name]

## 2019-06-20 NOTE — Telephone Encounter (Signed)
Pt calling about medication to take prior to CTA today. Rx bottle states to take with food  However instructions say no food for 4 hr prior to scan. I informed patient that the contrast for the cardiac CTA can make him nauseated, therefore wouldn't want him to eat a huge meal before the scan but a small snack with the medication would be okay.  He also said he would uber to the appt since dizziness is a concern for him  Pt appreciated the information.  Rockwell Alexandria RN Navigator Cardiac Imaging [facility] V[facility] [phone] Office  [phone] Cell

## 2019-06-20 NOTE — Progress Notes (Signed)
CT scan completed. Tolerated well. Pt's blood pressure 99 systolic. Pt states he feels fine and is ready to go. Denies any dizziness. Able to walk without difficulty. D/C  Home, pt not driving, being picked up. In no distress

## 2019-06-20 NOTE — Telephone Encounter (Signed)
Returning phone call after VM left on my mobile number. Pt did not answer so left VM on his.   Will await return call.  Rockwell Alexandria RN Navigator Cardiac Imaging [facility] V[facility] Services [phone] Office  [phone] Cell

## 2019-06-24 ENCOUNTER — Telehealth: Payer: Self-pay | Admitting: *Deleted

## 2019-06-24 DIAGNOSIS — R072 Precordial pain: Secondary | ICD-10-CM

## 2019-06-24 MED ORDER — ROSUVASTATIN CALCIUM 20 MG PO TABS: 20.0000 mg | ORAL_TABLET | Freq: Every day | ORAL | 3 refills | Status: AC

## 2019-06-24 MED ORDER — ASPIRIN EC 81 MG PO TBEC: 81.0000 mg | DELAYED_RELEASE_TABLET | Freq: Every day | ORAL | 3 refills | Status: AC

## 2019-06-24 NOTE — Telephone Encounter (Signed)
Follow Up:   Pt was returning Debra's call, concerning his CT results also.

## 2019-06-24 NOTE — Telephone Encounter (Signed)
Contacted patient, gave CT results. Advised of medication changes. RX sent to pharmacy  Lab work ordered.

## 2019-06-24 NOTE — Telephone Encounter (Addendum)
Left message for pt to call   ----- Message from Lewayne Bunting, MD sent at 06/20/2019  2:26 PM EDT ----- FU noncontrast chest CT 1 year; add asa 81 mg daily and crestor 20 mg daily for mild nonobstructive CAD; lipids and liver 12 weeks Olga Millers

## 2019-10-29 ENCOUNTER — Encounter: Payer: Self-pay | Admitting: *Deleted

## 2020-01-01 ENCOUNTER — Encounter: Payer: Self-pay | Admitting: Cardiology

## 2020-01-01 ENCOUNTER — Ambulatory Visit: Payer: 59 | Admitting: Cardiology

## 2020-01-01 ENCOUNTER — Other Ambulatory Visit: Payer: Self-pay

## 2020-01-01 VITALS — BP 118/82 | HR 60 | Ht 69.0 in | Wt 179.1 lb

## 2020-01-01 DIAGNOSIS — I2510 Atherosclerotic heart disease of native coronary artery without angina pectoris: Secondary | ICD-10-CM | POA: Diagnosis not present

## 2020-01-01 DIAGNOSIS — R002 Palpitations: Secondary | ICD-10-CM

## 2020-01-01 DIAGNOSIS — R072 Precordial pain: Secondary | ICD-10-CM

## 2020-01-01 NOTE — Patient Instructions (Signed)
  Lab Work:  Your physician recommends that you HAVE LAB WORK TODAY  If you have labs (blood work) drawn today and your tests are completely normal, you will receive your results only by: Marland Kitchen MyChart Message (if you have MyChart) OR . A paper copy in the mail If you have any lab test that is abnormal or we need to change your treatment, we will call you to review the results.   Testing/Procedures:  Your physician has requested that you have an echocardiogram. Echocardiography is a painless test that uses sound waves to create images of your heart. It provides your doctor with information about the size and shape of your heart and how well your heart's chambers and valves are working. This procedure takes approximately one hour. There are no restrictions for this procedure.[place] [facility]     Follow-Up: At [facility], you and your health needs are our priority.  As part of our continuing mission to provide you with exceptional heart care, we have created designated Provider Care Teams.  These Care Teams include your primary Cardiologist (physician) and Advanced Practice Providers (APPs -  Physician Assistants and Nurse Practitioners) who all work together to provide you with the care you need, when you need it.  We recommend signing up for the patient portal called "MyChart".  Sign up information is provided on this After Visit Summary.  MyChart is used to connect with patients for Virtual Visits (Telemedicine).  Patients are able to view lab/test results, encounter notes, upcoming appointments, etc.  Non-urgent messages can be sent to your provider as well.   To learn more about what you can do with MyChart, go to [email or web address].    Your next appointment:   6 month(s)  The format for your next appointment:   In Person  Provider:   Olga Millers, MD

## 2020-01-01 NOTE — Progress Notes (Signed)
HPI: FU CAD. Carotid Dopplers March 2021 showed 1 to 39% bilateral stenosis. CTA March 2021 showed calcium score 42 and minimal nonobstructive coronary artery disease. 4 mm right lower lobe pulmonary nodule noted and follow-up recommended 12 months. Since last seen patient describes occasional "pinching" sensation in his chest for 1 to 2 seconds.  He feels as though it may be his heart "beating".  There is no dyspnea on exertion, exertional chest pain or syncope.  Current Outpatient Medications  Medication Sig Dispense Refill  . aspirin EC 81 MG tablet Take 1 tablet (81 mg total) by mouth daily. 90 tablet 3  . levothyroxine (SYNTHROID, LEVOTHROID) 75 MCG tablet Take 75 mcg by mouth daily before breakfast.    . rosuvastatin (CRESTOR) 20 MG tablet Take 1 tablet (20 mg total) by mouth daily. 90 tablet 3   No current facility-administered medications for this visit.     Past Medical History:  Diagnosis Date  . History of kidney stones   . Hypothyroidism    takes synthroid    Past Surgical History:  Procedure Laterality Date  . EXTRACORPOREAL SHOCK WAVE LITHOTRIPSY Left 08/28/2017   Procedure: LEFT EXTRACORPOREAL SHOCK WAVE LITHOTRIPSY (ESWL);  Surgeon: Crista Elliot, MD;  Location: [facility];  Service: Urology;  Laterality: Left;  Marland Kitchen VARICOSE VEIN SURGERY Right 2018    Social History   Socioeconomic History  . Marital status: Divorced    Spouse name: Not on file  . Number of children: 2  . Years of education: Not on file  . Highest education level: Not on file  Occupational History  . Not on file  Tobacco Use  . Smoking status: Never Smoker  . Smokeless tobacco: Never Used  Vaping Use  . Vaping Use: Never used  Substance and Sexual Activity  . Alcohol use: Yes    Comment: Social  . Drug use: Never  . Sexual activity: Yes  Other Topics Concern  . Not on file  Social History Narrative  . Not on file   Social Determinants of Health   Financial Resource Strain:     . Difficulty of Paying Living Expenses: Not on file  Food Insecurity:   . Worried About Programme researcher, broadcasting/film/video in the Last Year: Not on file  . Ran Out of Food in the Last Year: Not on file  Transportation Needs:   . Lack of Transportation (Medical): Not on file  . Lack of Transportation (Non-Medical): Not on file  Physical Activity:   . Days of Exercise per Week: Not on file  . Minutes of Exercise per Session: Not on file  Stress:   . Feeling of Stress : Not on file  Social Connections:   . Frequency of Communication with Friends and Family: Not on file  . Frequency of Social Gatherings with Friends and Family: Not on file  . Attends Religious Services: Not on file  . Active Member of Clubs or Organizations: Not on file  . Attends Banker Meetings: Not on file  . Marital Status: Not on file  Intimate Partner Violence:   . Fear of Current or Ex-Partner: Not on file  . Emotionally Abused: Not on file  . Physically Abused: Not on file  . Sexually Abused: Not on file    No family history on file.  ROS: no fevers or chills, productive cough, hemoptysis, dysphasia, odynophagia, melena, hematochezia, dysuria, hematuria, rash, seizure activity, orthopnea, PND, pedal edema, claudication. Remaining systems are  negative.  Physical Exam: Well-developed well-nourished in no acute distress.  Skin is warm and dry.  HEENT is normal.  Neck is supple.  Chest is clear to auscultation with normal expansion.  Cardiovascular exam is regular rate and rhythm.  Abdominal exam nontender or distended. No masses palpated. Extremities show no edema. neuro grossly intact  A/P  1 coronary artery disease-nonobstructive on recent CTA.  Plan medical therapy.  Continue aspirin and statin.  2 hyperlipidemia-continue statin.  Check lipids and liver.  3 carotid artery disease mild on most recent Dopplers.  Continue medical therapy.  4 pulmonary nodule-plan follow-up noncontrast chest CT March  2022.  5 palpitations-patient is having occasional palpitations.  We will schedule an echocardiogram to assess LV function.  Can consider monitoring the future if needed.  Olga Millers, MD

## 2020-01-02 LAB — LIPID PANEL
Chol/HDL Ratio: 2.4 ratio (ref 0.0–5.0)
Cholesterol, Total: 149 mg/dL (ref 100–199)
HDL: 61 mg/dL (ref >39)
LDL Chol Calc (NIH): 71 mg/dL (ref 0–99)
Triglycerides: 88 mg/dL (ref 0–149)
VLDL Cholesterol Cal: 17 mg/dL (ref 5–40)

## 2020-01-02 LAB — HEPATIC FUNCTION PANEL
ALT: 35 IU/L (ref 0–44)
AST: 25 IU/L (ref 0–40)
Albumin: 4.8 g/dL (ref 3.8–4.8)
Alkaline Phosphatase: 102 IU/L (ref 44–121)
Bilirubin Total: 0.5 mg/dL (ref 0.0–1.2)
Bilirubin, Direct: 0.16 mg/dL (ref 0.00–0.40)
Total Protein: 6.8 g/dL (ref 6.0–8.5)

## 2020-01-03 ENCOUNTER — Encounter: Payer: Self-pay | Admitting: *Deleted

## 2020-01-15 ENCOUNTER — Telehealth: Payer: Self-pay | Admitting: Cardiology

## 2020-01-15 NOTE — Telephone Encounter (Signed)
Spoke with pt, Aware of dr crenshaw's recommendations.  °

## 2020-01-15 NOTE — Telephone Encounter (Signed)
Given mildly elevated calcium score/nonobstructive coronary disease would continue aspirin 81 mg daily. Olga Millers

## 2020-01-15 NOTE — Telephone Encounter (Signed)
New message:    Patient calling to ask some questions concering results.

## 2020-01-15 NOTE — Telephone Encounter (Signed)
Pt called and asked based on new study results that has recently been made public... should he continue taking ASA 81 mg or should he stop it.. I  advised him that I will need to defer to Dr. Jens Som for his review.

## 2020-01-24 ENCOUNTER — Other Ambulatory Visit (HOSPITAL_BASED_OUTPATIENT_CLINIC_OR_DEPARTMENT_OTHER): Payer: 59

## 2020-02-07 ENCOUNTER — Ambulatory Visit (HOSPITAL_BASED_OUTPATIENT_CLINIC_OR_DEPARTMENT_OTHER)
Admission: RE | Admit: 2020-02-07 | Discharge: 2020-02-07 | Disposition: A | Discharge status: Home / Self Care | Payer: 59 | Source: Ambulatory Visit | Attending: Cardiology | Admitting: Cardiology

## 2020-02-07 ENCOUNTER — Other Ambulatory Visit: Payer: Self-pay

## 2020-02-07 DIAGNOSIS — R002 Palpitations: Secondary | ICD-10-CM

## 2020-02-07 LAB — ECHOCARDIOGRAM COMPLETE
Area-P 1/2: 2.26 cm2
MV M vel: 4.22 m/s
MV Peak grad: 71.2 mmHg
S' Lateral: 3.82 cm

## 2020-02-10 ENCOUNTER — Encounter: Payer: Self-pay | Admitting: *Deleted

## 2020-04-12 IMAGING — CT CT HEART MORP W/ CTA COR W/ SCORE W/ CA W/CM &/OR W/O CM
4 of 7 series · 8 of 20 positions shown, 9 images · non-contrast
Comparison: None.
COMPARISON: None.

Addendum:
EXAM:
OVER-READ INTERPRETATION  CT CHEST

The following report is an over-read performed by radiologist Dr.
over-read does not include interpretation of cardiac or coronary
anatomy or pathology. The coronary CTA interpretation by the
cardiologist is attached.
TECHNIQUE: The patient was scanned on a [employer] scanner.

[Series 6: best diast 75 % · axial · 0.39mm/px · z∈[-123,-80]mm · 2 of 328 slices shown]
[im 110/328  vessel]
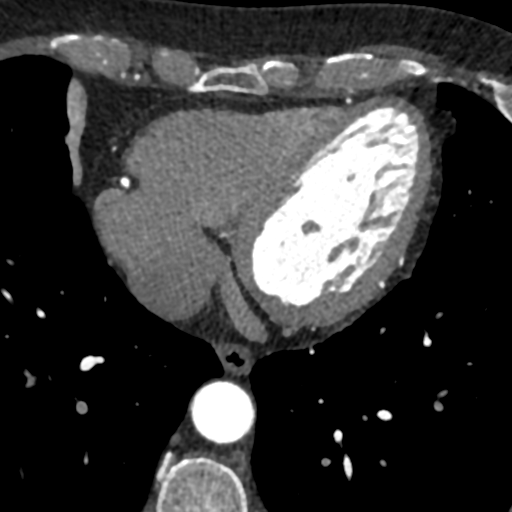
[im 219/328  vessel]
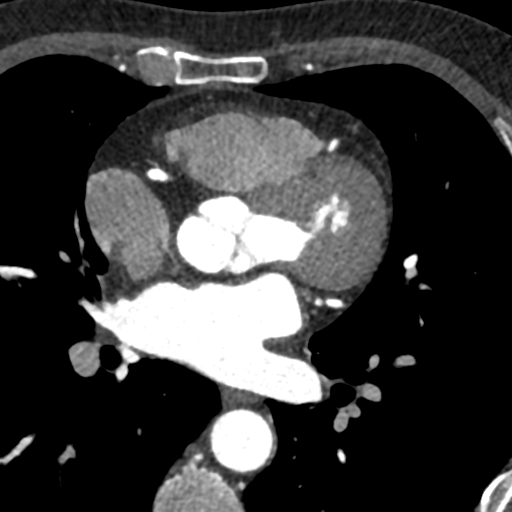

[Series 7: best syst · axial · 0.39mm/px · z∈[-123,-80]mm · 2 of 328 slices shown, 3 images]
[im 110/328  vessel]
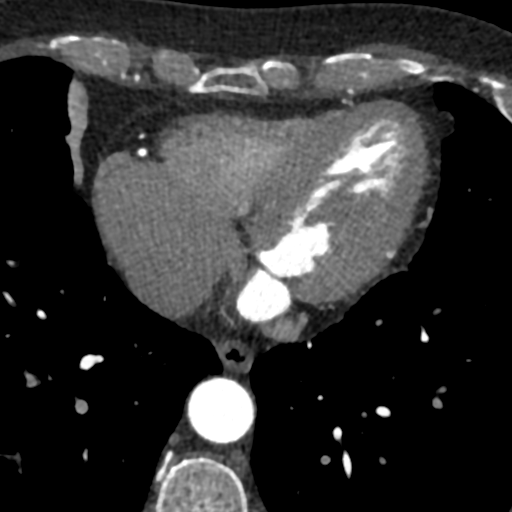
[im 110/328  lung]
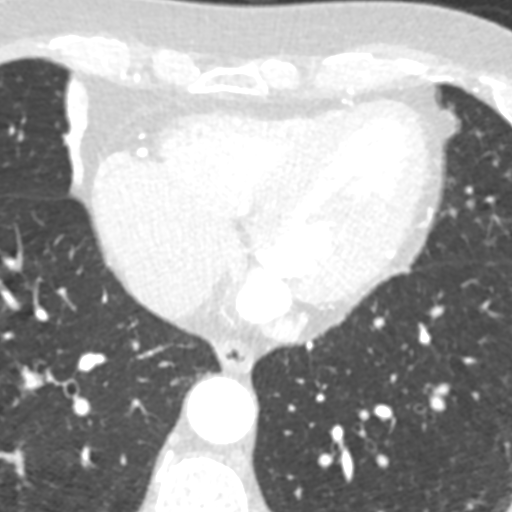
[im 219/328  vessel]
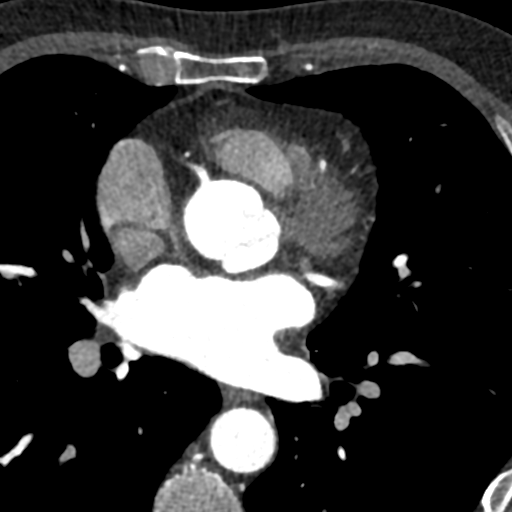

[Series 9: ts diast sharp · axial · 0.39mm/px · z∈[-123,-80]mm · 2 of 328 slices shown]
[im 110/328  lung]
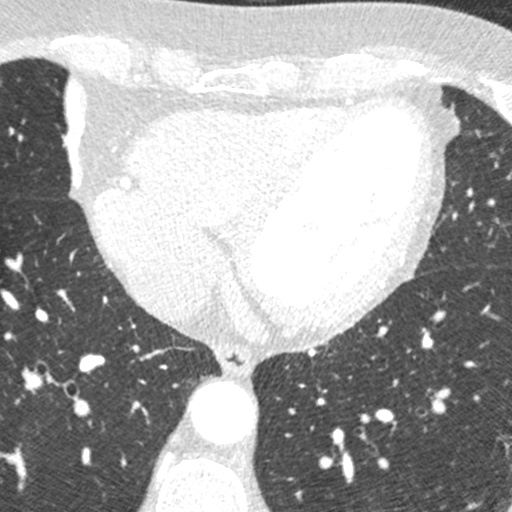
[im 219/328  lung]
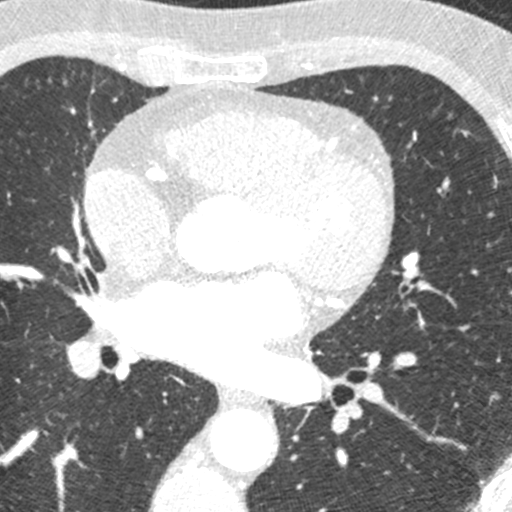

[Series 10: ts syst sharp · axial · 0.39mm/px · z∈[-123,-80]mm · 2 of 328 slices shown]
[im 110/328  lung]
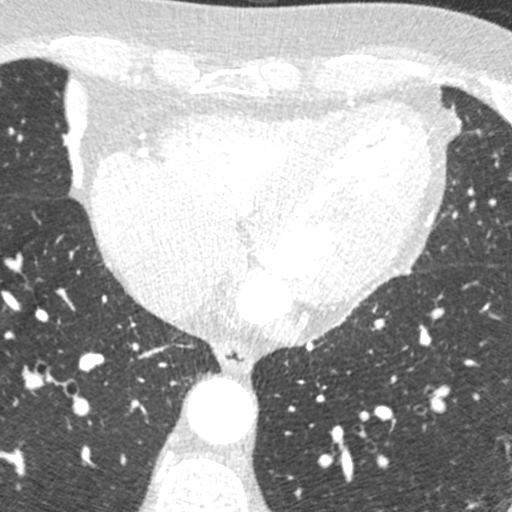
[im 219/328  lung]
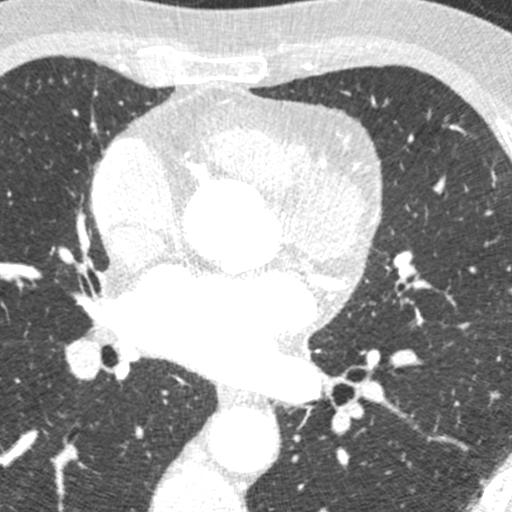

[8 of 20 positions shown; findings below may reference images not displayed]

FINDINGS: Vascular: No thoracic aortic aneurysm within the visualized chest.

Mediastinum/Nodes: No evidence for mediastinal or hilar
lymphadenopathy within the visualized central chest. Visualized
portions of the esophagus are unremarkable.

Lungs/Pleura: 4 mm right lower lobe pulmonary nodule identified on
image 18/series 12.

Upper Abdomen: No focal abnormality in the dome of the liver or
spleen.

Musculoskeletal: No worrisome lytic or sclerotic osseous
abnormality.
IMPRESSION: 4 mm right lower lobe pulmonary nodule. No follow-up needed if
patient is low-risk. Non-contrast chest CT can be considered in 12
months if patient is high-risk. This recommendation follows the
consensus statement: Guidelines for Management of Incidental
Pulmonary Nodules Detected on CT Images: From the [REDACTED]AL DATA:  64 yo male with chest pain

EXAM:
Cardiac/Coronary  CT
FINDINGS: A 120 kV prospective scan was triggered in the descending thoracic
aorta at 111 HU's. Axial non-contrast 3 mm slices were carried out
through the heart. The data set was analyzed on a dedicated work
station and scored using the Agatson method. Gantry rotation speed
was 250 msecs and collimation was .6 mm. No beta blockade and 0.8 mg
of sl NTG was given. The 3D data set was reconstructed in 5%
intervals of the 67-82 % of the R-R cycle. Diastolic phases were
analyzed on a dedicated work station using MPR, MIP and VRT modes.
The patient received 80 cc of contrast.

Aorta:  Normal size.  Mild atherosclerosis noted.  No dissection.

Aortic Valve:  Trileaflet.  No calcifications.

Coronary Arteries:  Normal coronary origin.  Right dominance.

RCA is a large dominant artery that gives rise to PDA. There is no
plaque.

Left main is a large artery that gives rise to LAD and LCX arteries.
There is minimal (0-24%) calcified plaque.

LAD is a large vessel that gives rise to small diagonal; there is
minimal (0-24%) calcified plaque in the proximal and mid vessel.

LCX is a non-dominant artery that gives rise to one large OM1
branch. There is minimal (0-24%) calcified plaque in the proximal to
mid vessel.

Other findings:

Normal pulmonary vein drainage into the left atrium.

Normal let atrial appendage without a thrombus.

Normal size of the pulmonary artery.
IMPRESSION: 1. Coronary calcium score of 42. This was 44 percentile for age and
sex matched control.

2. Normal coronary origin with right dominance.

3. Minimal (0-24%) nonobstructive CAD as outlined above.

Tii Patron Vulliamay

*** End of Addendum ***
EXAM:
OVER-READ INTERPRETATION  CT CHEST

The following report is an over-read performed by radiologist Dr.
over-read does not include interpretation of cardiac or coronary
anatomy or pathology. The coronary CTA interpretation by the
cardiologist is attached.
FINDINGS: Vascular: No thoracic aortic aneurysm within the visualized chest.

Mediastinum/Nodes: No evidence for mediastinal or hilar
lymphadenopathy within the visualized central chest. Visualized
portions of the esophagus are unremarkable.

Lungs/Pleura: 4 mm right lower lobe pulmonary nodule identified on
image 18/series 12.

Upper Abdomen: No focal abnormality in the dome of the liver or
spleen.

Musculoskeletal: No worrisome lytic or sclerotic osseous
abnormality.
IMPRESSION: 4 mm right lower lobe pulmonary nodule. No follow-up needed if
patient is low-risk. Non-contrast chest CT can be considered in 12
months if patient is high-risk. This recommendation follows the
consensus statement: Guidelines for Management of Incidental
Pulmonary Nodules Detected on CT Images: From the [HOSPITAL]

## 2020-06-08 ENCOUNTER — Telehealth: Payer: Self-pay | Admitting: *Deleted

## 2020-06-08 DIAGNOSIS — R918 Other nonspecific abnormal finding of lung field: Secondary | ICD-10-CM

## 2020-06-08 NOTE — Telephone Encounter (Signed)
Left message for pt, it is time to look at the lung nodule found on his cardiac CT done one year ago. Order placed for scan to be done at the [place] office. Patient to call back with questions.

## 2020-06-12 ENCOUNTER — Telehealth: Payer: Self-pay | Admitting: Cardiology

## 2020-06-12 NOTE — Telephone Encounter (Signed)
Left message for patient to call and discuss scheduling the CT chest ordered by Dr. Crenshaw 

## 2020-06-18 NOTE — Telephone Encounter (Signed)
Left message for patient to call and discuss scheduling the CT chest ordered by Dr. Crenshaw 

## 2020-06-22 NOTE — Telephone Encounter (Signed)
Left message for patient to call and discuss scheduling the CT chest ordered by Dr. Jens Som

## 2020-06-25 ENCOUNTER — Encounter: Payer: Self-pay | Admitting: Cardiology

## 2020-06-25 NOTE — Telephone Encounter (Signed)
Letter mailed requesting patient call to discuss scheduling the CT chest ordered by Dr. Jens Som

## 2020-07-02 ENCOUNTER — Other Ambulatory Visit: Payer: Self-pay

## 2020-07-02 ENCOUNTER — Ambulatory Visit (HOSPITAL_BASED_OUTPATIENT_CLINIC_OR_DEPARTMENT_OTHER)
Admission: RE | Admit: 2020-07-02 | Discharge: 2020-07-02 | Disposition: A | Discharge status: Home / Self Care | Payer: 59 | Source: Ambulatory Visit | Attending: Cardiology | Admitting: Cardiology

## 2020-07-02 DIAGNOSIS — R918 Other nonspecific abnormal finding of lung field: Secondary | ICD-10-CM | POA: Diagnosis not present

## 2021-06-16 ENCOUNTER — Encounter: Payer: Self-pay | Admitting: *Deleted

## 2021-06-16 NOTE — Telephone Encounter (Signed)
Left message for pt to call, he is due in April for a CT wo to follow up on a lung nodule. He is also overdue to be seen. ?

## 2021-06-22 ENCOUNTER — Encounter: Payer: Self-pay | Admitting: *Deleted

## 2021-06-22 NOTE — Telephone Encounter (Signed)
This encounter was created in error - please disregard.

## 2021-06-30 ENCOUNTER — Other Ambulatory Visit: Payer: Self-pay | Admitting: *Deleted

## 2021-06-30 DIAGNOSIS — R918 Other nonspecific abnormal finding of lung field: Secondary | ICD-10-CM

## 2021-06-30 NOTE — Progress Notes (Signed)
ct 

## 2021-07-06 ENCOUNTER — Other Ambulatory Visit: Payer: Self-pay | Admitting: *Deleted

## 2021-07-06 DIAGNOSIS — R918 Other nonspecific abnormal finding of lung field: Secondary | ICD-10-CM

## 2021-07-22 ENCOUNTER — Ambulatory Visit (INDEPENDENT_AMBULATORY_CARE_PROVIDER_SITE_OTHER)
Admission: RE | Admit: 2021-07-22 | Discharge: 2021-07-22 | Disposition: A | Discharge status: Home / Self Care | Payer: 59 | Source: Ambulatory Visit | Attending: Cardiology | Admitting: Cardiology

## 2021-07-22 DIAGNOSIS — R918 Other nonspecific abnormal finding of lung field: Secondary | ICD-10-CM

## 2024-01-08 ENCOUNTER — Encounter: Payer: Self-pay | Admitting: Urology

## 2024-01-08 ENCOUNTER — Other Ambulatory Visit: Payer: Self-pay | Admitting: Urology

## 2024-01-08 DIAGNOSIS — R972 Elevated prostate specific antigen [PSA]: Secondary | ICD-10-CM

## 2024-02-11 ENCOUNTER — Ambulatory Visit
Admission: RE | Admit: 2024-02-11 | Discharge: 2024-02-11 | Disposition: A | Discharge status: Home / Self Care | Source: Ambulatory Visit | Attending: Urology | Admitting: Urology

## 2024-02-11 DIAGNOSIS — R972 Elevated prostate specific antigen [PSA]: Secondary | ICD-10-CM

## 2024-02-11 MED ORDER — GADOPICLENOL 0.5 MMOL/ML IV SOLN
9.0000 mL | Freq: Once | INTRAVENOUS | Status: AC | PRN
  Administered 2024-02-11: 9 mL via INTRAVENOUS
